# Patient Record
Sex: Female | Born: 1995 | Race: White | Hispanic: No | Marital: Single | Smoking: Current every day smoker
Health system: Southern US, Community
[De-identification: ages and names within clinical notes are randomized; demographics above are authoritative.]

---

## 2016-07-27 ENCOUNTER — Encounter (HOSPITAL_COMMUNITY): Payer: Self-pay

## 2016-07-27 ENCOUNTER — Emergency Department (HOSPITAL_COMMUNITY)
Admission: EM | Admit: 2016-07-27 | Discharge: 2016-07-28 | Disposition: A | Discharge status: Home / Self Care | Payer: Self-pay | Attending: Emergency Medicine | Admitting: Emergency Medicine

## 2016-07-27 ENCOUNTER — Emergency Department (HOSPITAL_COMMUNITY): Payer: Self-pay

## 2016-07-27 DIAGNOSIS — R102 Pelvic and perineal pain: Secondary | ICD-10-CM

## 2016-07-27 DIAGNOSIS — F17200 Nicotine dependence, unspecified, uncomplicated: Secondary | ICD-10-CM | POA: Insufficient documentation

## 2016-07-27 DIAGNOSIS — J111 Influenza due to unidentified influenza virus with other respiratory manifestations: Secondary | ICD-10-CM | POA: Insufficient documentation

## 2016-07-27 DIAGNOSIS — R69 Illness, unspecified: Secondary | ICD-10-CM

## 2016-07-27 DIAGNOSIS — Z79899 Other long term (current) drug therapy: Secondary | ICD-10-CM | POA: Insufficient documentation

## 2016-07-27 DIAGNOSIS — N3000 Acute cystitis without hematuria: Secondary | ICD-10-CM | POA: Insufficient documentation

## 2016-07-27 DIAGNOSIS — R1030 Lower abdominal pain, unspecified: Secondary | ICD-10-CM

## 2016-07-27 LAB — URINALYSIS, ROUTINE W REFLEX MICROSCOPIC
Bilirubin Urine: NEGATIVE
Glucose, UA: NEGATIVE mg/dL
Hgb urine dipstick: NEGATIVE
Ketones, ur: 5 mg/dL — AB
NITRITE: NEGATIVE
PH: 5 (ref 5.0–8.0)
Protein, ur: NEGATIVE mg/dL
Specific Gravity, Urine: 1.029 (ref 1.005–1.030)

## 2016-07-27 LAB — CBC
HCT: 39.2 % (ref 36.0–46.0)
Hemoglobin: 12.6 g/dL (ref 12.0–15.0)
MCH: 28 pg (ref 26.0–34.0)
MCHC: 32.1 g/dL (ref 30.0–36.0)
MCV: 87.1 fL (ref 78.0–100.0)
PLATELETS: 342 10*3/uL (ref 150–400)
RBC: 4.5 MIL/uL (ref 3.87–5.11)
RDW: 14.4 % (ref 11.5–15.5)
WBC: 7.8 10*3/uL (ref 4.0–10.5)

## 2016-07-27 LAB — COMPREHENSIVE METABOLIC PANEL
ALK PHOS: 67 U/L (ref 38–126)
ALT: 57 U/L — AB (ref 14–54)
AST: 27 U/L (ref 15–41)
Albumin: 3.8 g/dL (ref 3.5–5.0)
Anion gap: 9 (ref 5–15)
BILIRUBIN TOTAL: 0.4 mg/dL (ref 0.3–1.2)
BUN: 9 mg/dL (ref 6–20)
CALCIUM: 8.9 mg/dL (ref 8.9–10.3)
CO2: 28 mmol/L (ref 22–32)
CREATININE: 1.05 mg/dL — AB (ref 0.44–1.00)
Chloride: 99 mmol/L — ABNORMAL LOW (ref 101–111)
GFR calc Af Amer: >60 mL/min (ref >60)
Glucose, Bld: 95 mg/dL (ref 65–99)
Potassium: 4.1 mmol/L (ref 3.5–5.1)
SODIUM: 136 mmol/L (ref 135–145)
TOTAL PROTEIN: 8.1 g/dL (ref 6.5–8.1)

## 2016-07-27 LAB — WET PREP, GENITAL
CLUE CELLS WET PREP: NONE SEEN
SPERM: NONE SEEN
TRICH WET PREP: NONE SEEN
Yeast Wet Prep HPF POC: NONE SEEN

## 2016-07-27 LAB — LIPASE, BLOOD: Lipase: 16 U/L (ref 11–51)

## 2016-07-27 LAB — POC URINE PREG, ED: Preg Test, Ur: NEGATIVE

## 2016-07-27 MED ORDER — SODIUM CHLORIDE 0.9 % IV BOLUS (SEPSIS)
1000.0000 mL | Freq: Once | INTRAVENOUS | Status: AC
  Administered 2016-07-27: 1000 mL via INTRAVENOUS

## 2016-07-27 MED ORDER — CEFTRIAXONE SODIUM 1 G IJ SOLR
1.0000 g | Freq: Once | INTRAMUSCULAR | Status: AC
  Administered 2016-07-27: 1 g via INTRAVENOUS
  Filled 2016-07-27: qty 10

## 2016-07-27 MED ORDER — CEPHALEXIN 500 MG PO CAPS: 500.0000 mg | ORAL_CAPSULE | Freq: Three times a day (TID) | ORAL | 0 refills | Status: AC

## 2016-07-27 MED ORDER — IBUPROFEN 800 MG PO TABS
800.0000 mg | ORAL_TABLET | Freq: Once | ORAL | Status: AC
  Administered 2016-07-27: 800 mg via ORAL
  Filled 2016-07-27: qty 1

## 2016-07-27 MED ORDER — MORPHINE SULFATE (PF) 4 MG/ML IV SOLN
4.0000 mg | Freq: Once | INTRAVENOUS | Status: AC
  Administered 2016-07-27: 4 mg via INTRAVENOUS
  Filled 2016-07-27: qty 1

## 2016-07-27 NOTE — ED Notes (Signed)
Patient states she is not able to give urine sample at this time.

## 2016-07-27 NOTE — ED Notes (Signed)
Pt given sprite 

## 2016-07-27 NOTE — ED Provider Notes (Signed)
[facility]-EMERGENCY DEPT Provider Note   CSN: [id number] Arrival date & time: 07/27/16  1653     History   Chief Complaint Chief Complaint  Patient presents with  . Abdominal Pain  . Cough    HPI Kaitlin Taylor is a 21 y.o. female.  HPI   Pt presents with two complaints.  Has had lower abdominal pain and vaginal pain x 2 weeks since last period when she used a menstrual cup for the first time. Pain radiates through to the back and is constant.  Also have dyspareunia.  Did have some bloody discharge a few days ago.  Has also had dysuria and urinary frequency.  States she did have trouble getting the cup in but denies ever pulling it and having difficulty breaking the suction.   LMP was on time but heavier than usual.  She has taken no medications for her symptoms. Denies new sexual partners or concern for STDs.   Has also had five days of flu-like symptoms.  Myalgias, cough productive of light green sputum, sore throat, rhinorrhea, headache, feeling hot.  Is feeling SOB.    History reviewed. No pertinent past medical history.  There are no active problems to display for this patient.   History reviewed. No pertinent surgical history.  OB History    No data available       Home Medications    Prior to Admission medications   Medication Sig Start Date End Date Taking? Authorizing Provider  cephALEXin (KEFLEX) 500 MG capsule Take 1 capsule (500 mg total) by mouth 3 (three) times daily. 07/27/16   Trixie DredgeEmily Chaney Maclaren, PA-C    Family History No family history on file.  Social History Social History  Substance Use Topics  . Smoking status: Current Every Day Smoker  . Smokeless tobacco: Never Used  . Alcohol use No     Allergies   Patient has no known allergies.   Review of Systems Review of Systems  All other systems reviewed and are negative.    Physical Exam Updated Vital Signs BP 100/57 (BP Location: Right Arm)   Pulse 91   Temp 99.2 F (37.3 C) (Oral)   Resp  18   Ht 5\' 4"  (1.626 m)   Wt 91.4 kg   LMP 07/13/2016 (Within Weeks)   SpO2 95%   BMI 34.60 kg/m   Physical Exam  Constitutional: She appears well-developed and well-nourished. No distress.  HENT:  Head: Normocephalic and atraumatic.  Neck: Neck supple.  Cardiovascular: Normal rate and regular rhythm.   Pulmonary/Chest: Effort normal and breath sounds normal. No respiratory distress. She has no wheezes. She has no rales.  Abdominal: Soft. She exhibits no distension. There is generalized tenderness. There is no rebound and no guarding.  Neurological: She is alert.  Skin: She is not diaphoretic.  Nursing note and vitals reviewed.    ED Treatments / Results  Labs (all labs ordered are listed, but only abnormal results are displayed) Labs Reviewed  WET PREP, GENITAL - Abnormal; Notable for the following:       Result Value   WBC, Wet Prep HPF POC MANY (*)    All other components within normal limits  COMPREHENSIVE METABOLIC PANEL - Abnormal; Notable for the following:    Chloride 99 (*)    Creatinine, Ser 1.05 (*)    ALT 57 (*)    All other components within normal limits  URINALYSIS, ROUTINE W REFLEX MICROSCOPIC - Abnormal; Notable for the following:    Ketones,  ur 5 (*)    Leukocytes, UA LARGE (*)    Bacteria, UA FEW (*)    Squamous Epithelial / LPF 0-5 (*)    All other components within normal limits  URINE CULTURE  LIPASE, BLOOD  CBC  RPR  HIV ANTIBODY (ROUTINE TESTING)  POC URINE PREG, ED  GC/CHLAMYDIA PROBE AMP ([place]) NOT AT [facility]    EKG  EKG Interpretation None       Radiology Dg Chest 2 View  Result Date: 07/27/2016 CLINICAL DATA:  Productive cough and diffuse myalgias for 5 days. EXAM: CHEST  2 VIEW COMPARISON:  None. FINDINGS: The heart size and mediastinal contours are within normal limits. Both lungs are clear. The visualized skeletal structures are unremarkable. IMPRESSION: No active cardiopulmonary disease. Electronically Signed   By: Ellery Plunk M.D.   On: 07/27/2016 18:36   US Transvaginal Non-ob  Result Date: 07/27/2016 CLINICAL DATA:  Pelvic pain. EXAM: TRANSABDOMINAL AND TRANSVAGINAL ULTRASOUND OF PELVIS DOPPLER ULTRASOUND OF OVARIES TECHNIQUE: Both transabdominal and transvaginal ultrasound examinations of the pelvis were performed. Transabdominal technique was performed for global imaging of the pelvis including uterus, ovaries, adnexal regions, and pelvic cul-de-sac. It was necessary to proceed with endovaginal exam following the transabdominal exam to visualize the adnexa. Color and duplex Doppler ultrasound was utilized to evaluate blood flow to the ovaries. COMPARISON:  None. FINDINGS: Uterus Measurements: 7.9 x 3.3 x 5.2 cm. No fibroids or other mass visualized. Endometrium Thickness: 7.5 mm.  No focal abnormality visualized. Right ovary Measurements: 4.0 x 3.0 x 3.0 cm. Normal appearance/no adnexal mass. Left ovary Measurements: 4.5 x 3.2 x 3.6 cm. Normal appearance/no adnexal mass. Pulsed Doppler evaluation of both ovaries demonstrates normal low-resistance arterial and venous waveforms. Other findings No abnormal free fluid. IMPRESSION: Normal pelvic ultrasound. Electronically Signed   By: Ted Mcalpine M.D.   On: 07/27/2016 20:28   US Pelvis Complete  Result Date: 07/27/2016 CLINICAL DATA:  Pelvic pain. EXAM: TRANSABDOMINAL AND TRANSVAGINAL ULTRASOUND OF PELVIS DOPPLER ULTRASOUND OF OVARIES TECHNIQUE: Both transabdominal and transvaginal ultrasound examinations of the pelvis were performed. Transabdominal technique was performed for global imaging of the pelvis including uterus, ovaries, adnexal regions, and pelvic cul-de-sac. It was necessary to proceed with endovaginal exam following the transabdominal exam to visualize the adnexa. Color and duplex Doppler ultrasound was utilized to evaluate blood flow to the ovaries. COMPARISON:  None. FINDINGS: Uterus Measurements: 7.9 x 3.3 x 5.2 cm. No fibroids or other mass  visualized. Endometrium Thickness: 7.5 mm.  No focal abnormality visualized. Right ovary Measurements: 4.0 x 3.0 x 3.0 cm. Normal appearance/no adnexal mass. Left ovary Measurements: 4.5 x 3.2 x 3.6 cm. Normal appearance/no adnexal mass. Pulsed Doppler evaluation of both ovaries demonstrates normal low-resistance arterial and venous waveforms. Other findings No abnormal free fluid. IMPRESSION: Normal pelvic ultrasound. Electronically Signed   By: Ted Mcalpine M.D.   On: 07/27/2016 20:28   [place] Art/ven Flow Abd Pelv Doppler  Result Date: 07/27/2016 CLINICAL DATA:  Pelvic pain. EXAM: TRANSABDOMINAL AND TRANSVAGINAL ULTRASOUND OF PELVIS DOPPLER ULTRASOUND OF OVARIES TECHNIQUE: Both transabdominal and transvaginal ultrasound examinations of the pelvis were performed. Transabdominal technique was performed for global imaging of the pelvis including uterus, ovaries, adnexal regions, and pelvic cul-de-sac. It was necessary to proceed with endovaginal exam following the transabdominal exam to visualize the adnexa. Color and duplex Doppler ultrasound was utilized to evaluate blood flow to the ovaries. COMPARISON:  None. FINDINGS: Uterus Measurements: 7.9 x 3.3 x 5.2 cm. No fibroids  or other mass visualized. Endometrium Thickness: 7.5 mm.  No focal abnormality visualized. Right ovary Measurements: 4.0 x 3.0 x 3.0 cm. Normal appearance/no adnexal mass. Left ovary Measurements: 4.5 x 3.2 x 3.6 cm. Normal appearance/no adnexal mass. Pulsed Doppler evaluation of both ovaries demonstrates normal low-resistance arterial and venous waveforms. Other findings No abnormal free fluid. IMPRESSION: Normal pelvic ultrasound. Electronically Signed   By: Ted Mcalpine M.D.   On: 07/27/2016 20:28    Procedures Procedures (including critical care time)  Medications Ordered in ED Medications  cefTRIAXone (ROCEPHIN) 1 g in dextrose 5 % 50 mL IVPB (not administered)  ibuprofen (ADVIL,MOTRIN) tablet 800 mg (800 mg Oral Given  07/27/16 2018)  morphine 4 MG/ML injection 4 mg (4 mg Intravenous Given 07/27/16 2018)  sodium chloride 0.9 % bolus 1,000 mL (0 mLs Intravenous Stopped 07/27/16 2141)  sodium chloride 0.9 % bolus 1,000 mL (1,000 mLs Intravenous New Bag/Given 07/27/16 2150)     Initial Impression / Assessment and Plan / ED Course  I have reviewed the triage vital signs and the nursing notes.  Pertinent labs & imaging results that were available during my care of the patient were reviewed by me and considered in my medical decision making (see chart for details).     Afebrile, nontoxic patient with lower abdominal pain x 2 weeks.  Pelvic US is negative.  Wet prep with WBC only, pt has low concern for STD.  UA  does appear infected.  Culture sent.  Will treat with rocephin in ED and keflex at home.    Also with 5 days of influenza-like illness.  CXR negative.  Pt advised to alternate tylenol and ibuprofen.   D/C home with PCP follow up.   Discussed result, findings, treatment, and follow up  with patient.  Pt given return precautions.  Pt verbalizes understanding and agrees with plan.       Final Clinical Impressions(s) / ED Diagnoses   Final diagnoses:  Pelvic pain  Influenza-like illness  Lower abdominal pain  Acute cystitis without hematuria    New Prescriptions New Prescriptions   CEPHALEXIN (KEFLEX) 500 MG CAPSULE    Take 1 capsule (500 mg total) by mouth 3 (three) times daily.     Trixie Dredge, PA-C 07/27/16 2254    Derwood Kaplan, MD 07/29/16 [phone]

## 2016-07-27 NOTE — ED Triage Notes (Signed)
Pt presents with c/o pelvic pain. Pt reports that she is having pain "in her uterus". Pt denies N/V/D. Pt reports the pain has been present since her last period approx 2 weeks ago. Pt said her last period was the first time she has ever used the menstruation cup. Pt also c/o flu-like symptoms, said that she is coughing up green mucus, and has body aches.

## 2016-07-27 NOTE — ED Notes (Signed)
Patient given sprite to drink, reminded about UA. Patient is drowsy from recent pain medication.

## 2016-07-27 NOTE — ED Notes (Signed)
Asked pt if they could give urine sample, but they said they could not.

## 2016-07-27 NOTE — ED Notes (Signed)
Patient reminded urinalysis is needed, still unable to void. Provider notified, IV fluids given to assist due to NPO at this time.

## 2016-07-27 NOTE — Discharge Instructions (Signed)
Read the information below.  Use the prescribed medication as directed.  Please discuss all new medications with your pharmacist.  You may return to the Emergency Department at any time for worsening condition or any new symptoms that concern you.   If you develop high fevers, worsening abdominal pain, uncontrolled vomiting, or are unable to tolerate fluids by mouth, return to the ER for a recheck.  ° °

## 2016-07-28 LAB — GC/CHLAMYDIA PROBE AMP (~~LOC~~) NOT AT ARMC
Chlamydia: POSITIVE — AB
NEISSERIA GONORRHEA: NEGATIVE

## 2016-07-28 LAB — RPR: RPR: NONREACTIVE

## 2016-07-28 LAB — HIV ANTIBODY (ROUTINE TESTING W REFLEX): HIV Screen 4th Generation wRfx: NONREACTIVE

## 2016-07-28 NOTE — ED Notes (Signed)
Bed: WA17 Expected date:  Expected time:  Means of arrival:  Comments: Pt still in room  

## 2016-07-30 LAB — URINE CULTURE

## 2016-07-31 ENCOUNTER — Telehealth: Payer: Self-pay | Admitting: Emergency Medicine

## 2016-07-31 NOTE — Telephone Encounter (Signed)
Post ED Visit - Positive Culture Follow-up  Culture report reviewed by antimicrobial stewardship pharmacist:  []  Enzo BiNathan Batchelder, Pharm.D. [x]  Celedonio MiyamotoJeremy Frens, [address]Ph[address] BCPS []  Garvin FilaMike Maccia, Pharm.D. []  Georgina PillionElizabeth Martin, Pharm.D., BCPS []  LewisberryMinh Pham, [place]D., BCPS, AAHIVP []  Estella HuskMichelle Turner, Pharm.D., BCPS, AAHIVP []  Tennis Mustassie Stewart, [address] []  Sherle Poeob Vincent, [address]  Positive urine culture Treated with cephalexin, organism sensitive to the same and no further patient follow-up is required at this time.  Berle MullMiller, Donielle Kaigler 07/31/2016, 1:24 PM

## 2018-07-21 IMAGING — CR DG CHEST 2V
2 series · 2 of 2 positions shown · non-contrast
Comparison: None.

CLINICAL DATA: Productive cough and diffuse myalgias for 5 days.

EXAM:
CHEST  2 VIEW

[w chest pa]
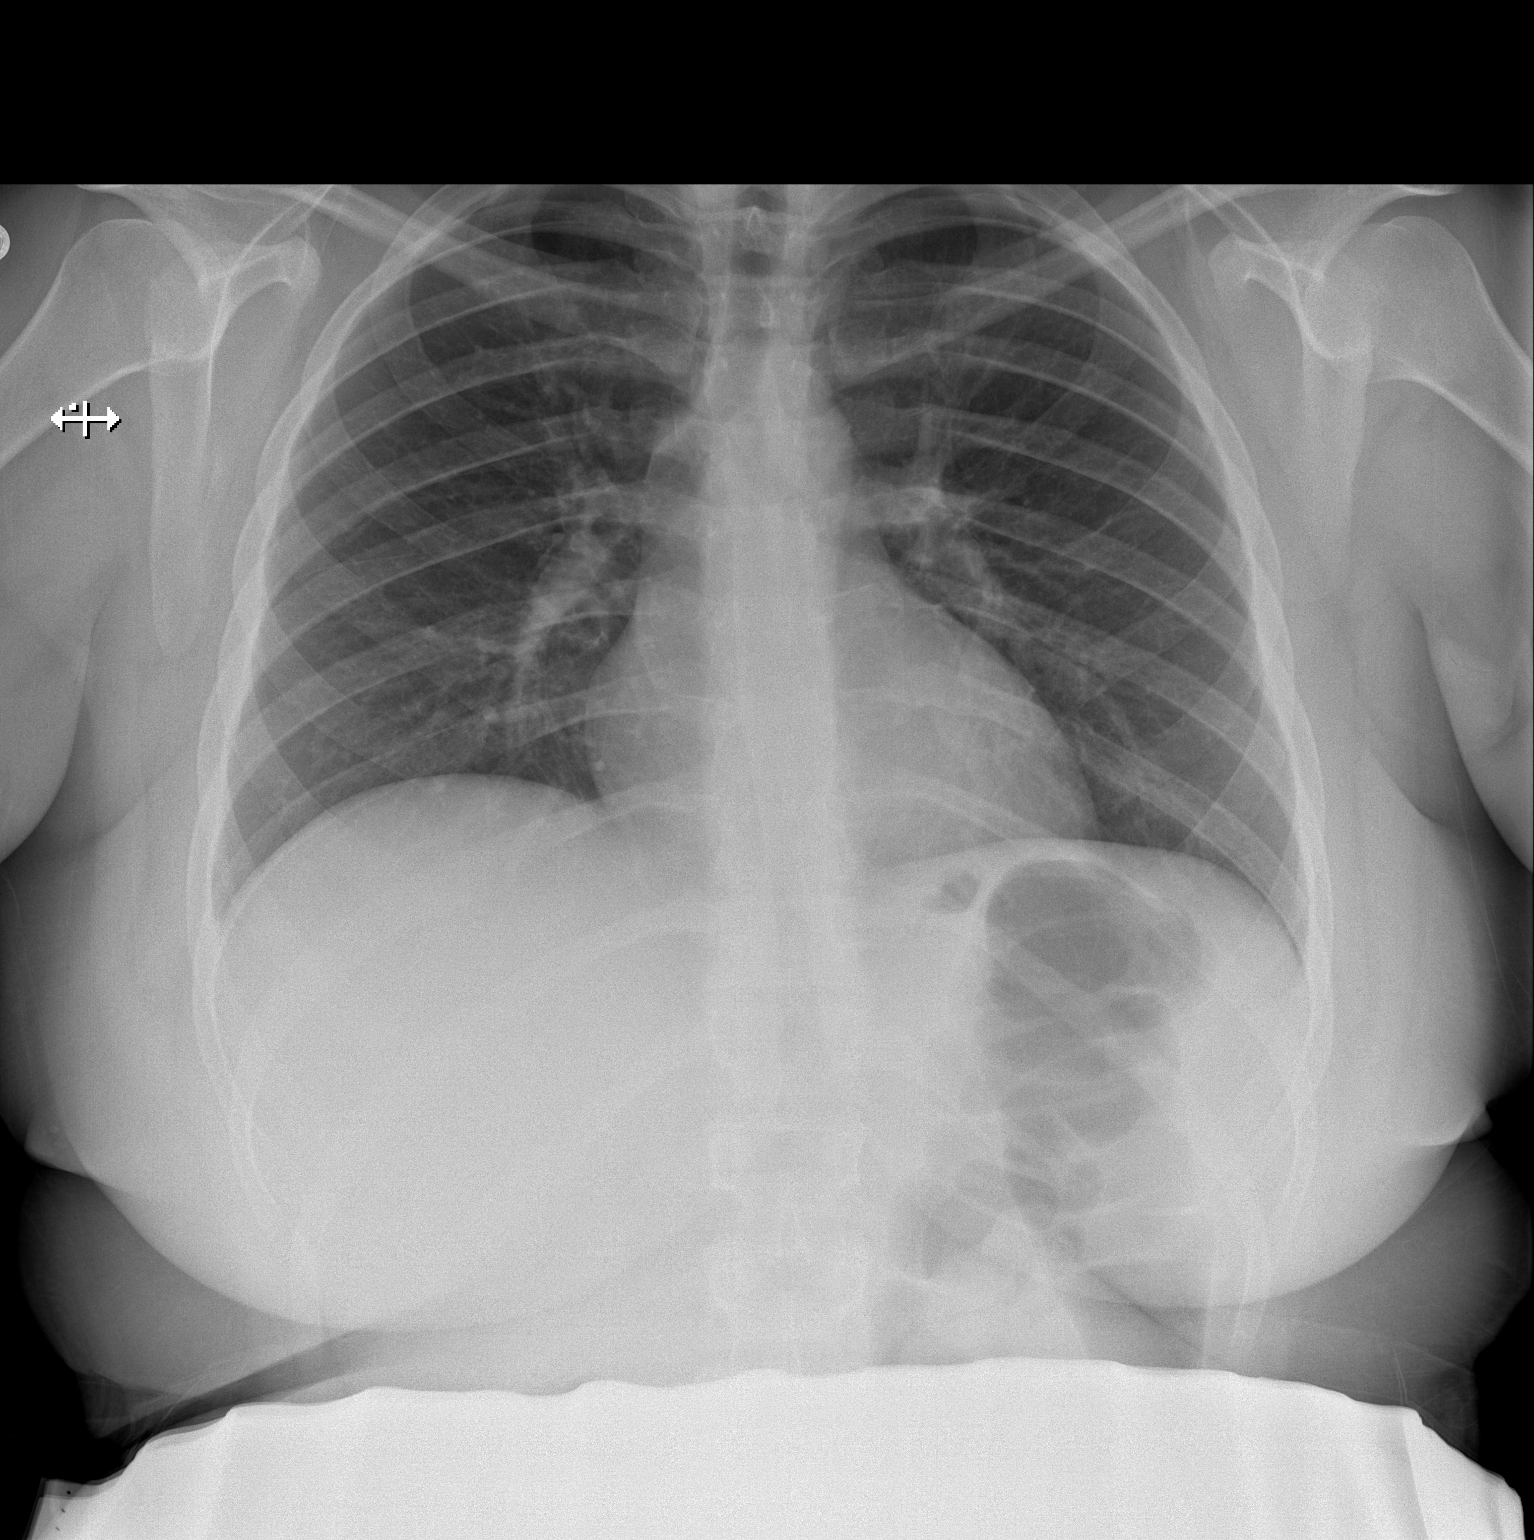

[w chest lat]
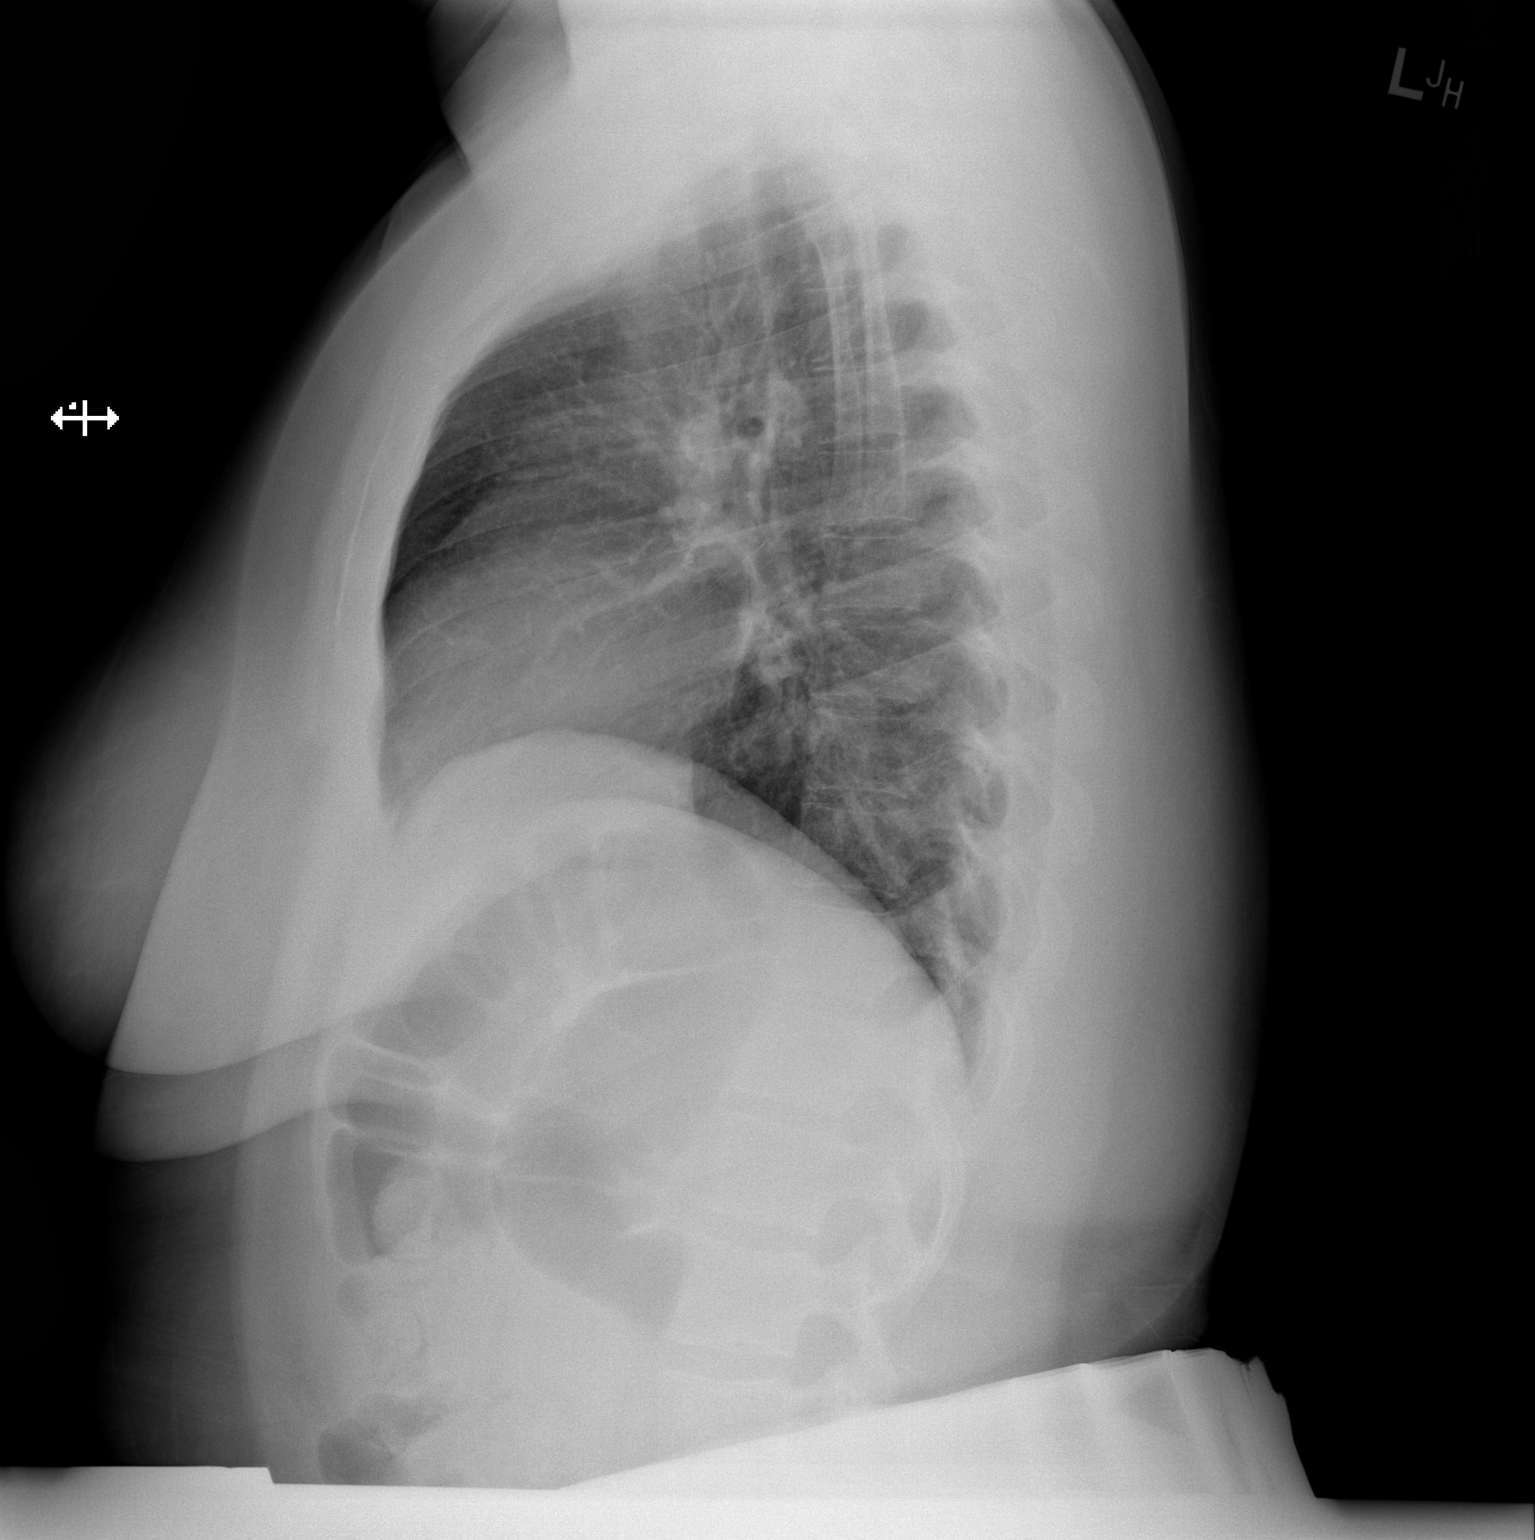

[2 of 2 positions shown; findings below may reference images not displayed]

FINDINGS: The heart size and mediastinal contours are within normal limits.
Both lungs are clear. The visualized skeletal structures are
unremarkable.
IMPRESSION: No active cardiopulmonary disease.

## 2019-04-27 DEATH — deceased
